# Patient Record
Sex: Male | Born: 2000
Health system: Southern US, Community
[De-identification: ages and names within clinical notes are randomized; demographics above are authoritative.]

## PROBLEM LIST (undated history)

## (undated) DIAGNOSIS — K4090 Unilateral inguinal hernia, without obstruction or gangrene, not specified as recurrent: Secondary | ICD-10-CM

## (undated) DIAGNOSIS — G43D0 Abdominal migraine, not intractable: Secondary | ICD-10-CM

## (undated) DIAGNOSIS — F909 Attention-deficit hyperactivity disorder, unspecified type: Secondary | ICD-10-CM

## (undated) DIAGNOSIS — J302 Other seasonal allergic rhinitis: Secondary | ICD-10-CM

## (undated) HISTORY — PX: CIRCUMCISION: SHX1350

---
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
## 2021-04-05 ENCOUNTER — Ambulatory Visit (INDEPENDENT_AMBULATORY_CARE_PROVIDER_SITE_OTHER): Payer: 59 | Admitting: Family Medicine

## 2021-04-05 ENCOUNTER — Encounter: Payer: Self-pay | Admitting: Family Medicine

## 2021-04-05 ENCOUNTER — Other Ambulatory Visit: Payer: Self-pay

## 2021-04-05 VITALS — BP 90/70 | HR 74 | Temp 97.6°F | Ht 67.0 in | Wt 165.5 lb

## 2021-04-05 DIAGNOSIS — Z0000 Encounter for general adult medical examination without abnormal findings: Secondary | ICD-10-CM

## 2021-04-05 MED ORDER — LISDEXAMFETAMINE DIMESYLATE 40 MG PO CAPS: 40.0000 mg | ORAL_CAPSULE | ORAL | 0 refills | Status: DC

## 2021-04-05 NOTE — Progress Notes (Signed)
William Diaz DOB: 08/25/2001 Encounter date: 04/05/2021  This is a 20 y.o. male who presents for complete physical   History of present illness/Additional concerns:  Things have been pretty good for him. Started college. 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 Still interested in cyber security.   Still exercising regularly. Still eating healthy. He is still vaping; daily multiple times.  Seasonal allergies: always act up in summer. Xyzal helps. Takes just in summer and then as needed.   ADD: didn't use medication last year. Hard to complete tasks, studying, work without medications. Grades were good first semester all A's, B's. A few C;s second semester.    Past Medical History:  Diagnosis Date   Abdominal migraine    ADHD (attention deficit hyperactivity disorder)    Inguinal hernia 01/2013   left   Seasonal allergies    Past Surgical History:  Procedure Laterality Date   CIRCUMCISION     INGUINAL HERNIA REPAIR Left 01/17/2013   Procedure: LEFT INGUINAL HERNIA REPAIR PEDIATRIC;  Surgeon: Judie Petit. Leonia Corona, MD;  Location: [facility];  Service: Pediatrics;  Laterality: Left;   Allergies  Allergen Reactions   Amoxicillin Rash   Current Meds  Medication Sig   levocetirizine (XYZAL) 5 MG tablet Take 5 mg by mouth every evening.   lisdexamfetamine (VYVANSE) 40 MG capsule    Social History   Tobacco Use   Smoking status: Never   Smokeless tobacco: Never   Tobacco comments:    father smokes outside  Substance Use Topics   Alcohol use: Yes    Alcohol/week: 5.0 standard drinks    Types: 5 Cans of beer per week   Family History  Problem Relation Age of Onset   Diabetes Maternal Grandfather    Heart disease Maternal Grandfather    Stroke Maternal Grandfather    Anesthesia problems Mother        post-op N/V   Migraines Mother        Occular Migraines   Miscarriages / [place] Mother    High blood pressure Father    High  Cholesterol Father    Healthy Sister    Colon cancer Maternal Grandmother    Heart attack Paternal Grandfather    Hypertension Maternal Uncle    Migraines Maternal Aunt        Occular Migraines   Migraines Cousin        Occular Migraines     Review of Systems  Constitutional:  Negative for activity change, appetite change, chills, fatigue, fever and unexpected weight change.  HENT:  Negative for congestion, ear pain, hearing loss, sinus pressure, sinus pain, sore throat and trouble swallowing.   Eyes:  Negative for pain and visual disturbance.  Respiratory:  Negative for cough, chest tightness, shortness of breath and wheezing.   Cardiovascular:  Negative for chest pain, palpitations and leg swelling.  Gastrointestinal:  Negative for abdominal distention, abdominal pain, blood in stool, constipation, diarrhea, nausea and vomiting.  Genitourinary:  Negative for decreased urine volume, difficulty urinating, dysuria, penile pain and testicular pain.  Musculoskeletal:  Negative for arthralgias, back pain and joint swelling.  Skin:  Negative for rash.  Neurological:  Negative for dizziness, weakness, numbness and headaches.  Hematological:  Negative for adenopathy. Does not bruise/bleed easily.  Psychiatric/Behavioral:  Negative for agitation, sleep disturbance and suicidal ideas. The patient is not nervous/anxious.    CBC:  Lab Results  Component Value Date   WBC 5.3 01/28/2015   HGB 14.3 01/28/2015  HCT 41.5 01/28/2015   MCH 28.5 01/28/2015   MCHC 34.5 01/28/2015   RDW 12.6 01/28/2015   PLT 169 01/28/2015   CMP: Lab Results  Component Value Date   NA 139 03/25/2020   K 4.3 03/25/2020   CL 102 03/25/2020   CO2 24 03/25/2020   ANIONGAP 8 01/28/2015   GLUCOSE 86 03/25/2020   BUN 11 03/25/2020   CREATININE 1.01 03/25/2020   GFRAA NOT CALCULATED 01/28/2015   CALCIUM 9.8 03/25/2020   PROT 7.2 08/24/2020   BILITOT 0.8 08/24/2020   ALKPHOS 287 01/28/2015   ALT 21 08/24/2020    AST 20 08/24/2020   LIPID: Lab Results  Component Value Date   CHOL 174 (H) 03/25/2020   TRIG 120 (H) 03/25/2020   HDL 48 03/25/2020   LDLCALC 104 03/25/2020    Objective:  BP 90/70 (BP Location: Left Arm, Patient Position: Sitting, Cuff Size: Large)   Pulse 74   Temp 97.6 F (36.4 C) (Oral)   Ht 5\' 7"  (1.702 m)   Wt 165 lb 8 oz (75.1 kg)   SpO2 98%   BMI 25.92 kg/m   Weight: 165 lb 8 oz (75.1 kg)   BP Readings from Last 3 Encounters:  04/05/21 90/70  03/25/20 102/74  10/26/17 (!) 97/59   Wt Readings from Last 3 Encounters:  04/05/21 165 lb 8 oz (75.1 kg) (69 %, Z= 0.48)*  03/25/20 167 lb 3.2 oz (75.8 kg) (76 %, Z= 0.69)*  10/26/17 119 lb 14.9 oz (54.4 kg) (30 %, Z= -0.51)*   * Growth percentiles are based on CDC (Boys, 2-20 Years) data.    Physical Exam Constitutional:      General: He is not in acute distress.    Appearance: He is well-developed.  HENT:     Head: Normocephalic and atraumatic.     Right Ear: External ear normal.     Left Ear: External ear normal.     Nose: Nose normal.     Mouth/Throat:     Pharynx: No oropharyngeal exudate.  Eyes:     Conjunctiva/sclera: Conjunctivae normal.     Pupils: Pupils are equal, round, and reactive to light.  Neck:     Thyroid: No thyromegaly.  Cardiovascular:     Rate and Rhythm: Normal rate and regular rhythm.     Heart sounds: Normal heart sounds. No murmur heard.   No friction rub. No gallop.  Pulmonary:     Effort: Pulmonary effort is normal. No respiratory distress.     Breath sounds: Normal breath sounds. No stridor. No wheezing or rales.  Abdominal:     General: Bowel sounds are normal.     Palpations: Abdomen is soft.  Genitourinary:    Penis: Normal.      Testes: Normal.  Musculoskeletal:        General: Normal range of motion.     Cervical back: Neck supple.  Skin:    General: Skin is warm and dry.  Neurological:     Mental Status: He is alert and oriented to person, place, and time.   Psychiatric:        Behavior: Behavior normal.        Thought Content: Thought content normal.        Judgment: Judgment normal.    Assessment/Plan: Health Maintenance Due  Topic Date Due   INFLUENZA VACCINE  04/05/2021   Health Maintenance reviewed.  1. Preventative health care Discussed working on quitting vaping. Discussed coming up with plan  to stay successful with quitting. Keep up with regular exercise and healthy eating. He did get third covid vaccine. Uncertain dates.    Return in about 1 year (around 04/05/2022) for physical exam.  Theodis Shove, MD

## 2021-04-15 ENCOUNTER — Telehealth: Payer: Self-pay | Admitting: *Deleted

## 2021-04-15 NOTE — Telephone Encounter (Signed)
Prior auth for Vyvanse 40mg  capsules sent to [email or web address] .

## 2021-04-19 ENCOUNTER — Telehealth: Payer: Self-pay

## 2021-04-19 NOTE — Telephone Encounter (Signed)
See prior phone note. 

## 2021-04-19 NOTE — Telephone Encounter (Signed)
See prior phone note from the patients mother which indicates insurance will not pay for the medication and options were given.

## 2021-04-19 NOTE — Telephone Encounter (Signed)
Per note in Covermymeds.com the Rx was covered as below: Approvedon August 11 Request Reference Number: [id number]. VYVANSE CAP 40MG  is approved through 04/15/2022. Your patient may now fill this prescription and it will be covered.  I informed 06/15/2022 at [facility] of this and left a detailed message at the patients home number with this information and to contact the pharmacy with further questions.

## 2021-04-19 NOTE — Telephone Encounter (Signed)
See other message. Did we get a denial on the vyvanse? Since he was on it previously and sx were controlled; there is chance they would cover. Please make sure on prior auth it is documented that he is not functioning well without medication and that this would be continuation of medication.

## 2021-04-19 NOTE — Telephone Encounter (Signed)
Let me know how he did with concerta/methylphenidate in the past? These both look like lower tiers when I pull up formulary online. Adderall would be option too. Let me know if any negative side effects with the concerta in the past. Once I know this I will send something in. We may have to have him follow up to dose adjust since we may be trying something new.

## 2021-04-20 ENCOUNTER — Telehealth: Payer: Self-pay

## 2021-04-20 NOTE — Telephone Encounter (Signed)
Patient's mother stated the lisdexamfetamine (VYVANSE) 40 MG capsule will cost her $300 and would like to know if there is a generic available.

## 2021-04-21 ENCOUNTER — Other Ambulatory Visit: Payer: Self-pay | Admitting: Family Medicine

## 2021-04-21 MED ORDER — AMPHETAMINE-DEXTROAMPHET ER 10 MG PO CP24: 10.0000 mg | ORAL_CAPSULE | Freq: Every day | ORAL | 0 refills | Status: DC

## 2021-04-21 NOTE — Telephone Encounter (Signed)
Ok. Let's try a lower end dose of adderall. We may have to work with insurance for coverage of whatever we choose, but this looked like it was better covered. I'm starting with low dose in hopes that we minimize side effects (typically with add meds these are loss of appetite and difficulty with sleep) - take med in morning to avoid sleeping issues at night. Update me in a couple of weeks with how he does with this.

## 2021-04-21 NOTE — Telephone Encounter (Signed)
Spoke with the patients mother and she stated the patient has only tried Concerta in the past and this worked for years.  She stated the patient discontinued this as it "made him feel like a zombie".  Message sent to PCP.

## 2021-04-21 NOTE — Telephone Encounter (Signed)
Please see other phone messages regarding medication. Need to know history of tolerance with other meds tried.

## 2021-04-21 NOTE — Telephone Encounter (Signed)
Spoke with the patients mother and informed her of the information below.

## 2022-02-28 NOTE — Progress Notes (Signed)
HPI: William Diaz is a 21 y.o. male, who is here today to establish care.  Former PCP: Dr. Hassan Diaz Last preventive routine visit: 04/05/21.  Chronic medical problems: Abdominal migraine,seasonal allergies,HLD,and ADHD among some. 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 Major in Sport and exercise psychologist.  Seasonal allergies: He is on Xyzal 5 mg daily. Post nasal drainage, cannot cough it up some times.It seems to be worse for the past 6 weeks. Xyzal does not help all the time. He has not identified exacerbating or alleviating factors.  Negative for fever,chills, cough,wheezing,stridor,or SOB. + Nasal congestion and mild rhinorrhea.  ADHD dx'ed in 3rd grade, 74-25 year old. He takes Adderall  XL 10 mg daily as needed. Medication helps, he takes it when he has to prepare final or has projects to complete. Denies anxiety or depression. Sleeps well, 7-8 hours.  Concerned about not being able to lose wt. He is counting calories and for the past few weeks try to stay between 1800-2000 cal/day and exercising regular ly. Lab Results  Component Value Date   CHOL 174 (H) 03/25/2020   HDL 48 03/25/2020   LDLCALC 104 03/25/2020   TRIG 120 (H) 03/25/2020   CHOLHDL 3.6 03/25/2020   Review of Systems  Constitutional:  Negative for activity change, appetite change and fever.  HENT:  Negative for mouth sores, nosebleeds and sore throat.   Eyes:  Negative for redness and visual disturbance.  Cardiovascular:  Negative for chest pain, palpitations and leg swelling.  Gastrointestinal:  Negative for abdominal pain, nausea and vomiting.  Endocrine: Negative for cold intolerance and heat intolerance.  Musculoskeletal:  Negative for gait problem and myalgias.  Allergic/Immunologic: Positive for environmental allergies.  Neurological:  Negative for syncope, weakness and headaches.  Rest see pertinent positives and negatives per HPI.  Current Outpatient Medications on File Prior to  Visit  Medication Sig Dispense Refill   levocetirizine (XYZAL) 5 MG tablet Take 5 mg by mouth every evening.     No current facility-administered medications on file prior to visit.   Past Medical History:  Diagnosis Date   Abdominal migraine    ADHD (attention deficit hyperactivity disorder)    Inguinal hernia 01/2013   left   Seasonal allergies    Allergies  Allergen Reactions   Amoxicillin Rash   Family History  Problem Relation Age of Onset   Diabetes Maternal Grandfather    Heart disease Maternal Grandfather    Stroke Maternal Grandfather    Anesthesia problems Mother        post-op N/V   Migraines Mother        Occular Migraines   Miscarriages / William Diaz Mother    High blood pressure Father    High Cholesterol Father    Healthy Sister    Colon cancer Maternal Grandmother    Heart attack Paternal Grandfather    Hypertension Maternal Uncle    Migraines Maternal Aunt        Occular Migraines   Migraines Cousin        Occular Migraines   Social History   Socioeconomic History   Marital status: Single    Spouse name: Not on file   Number of children: Not on file   Years of education: Not on file   Highest education level: Not on file  Occupational History   Not on file  Tobacco Use   Smoking status: Never   Smokeless tobacco: Never   Tobacco comments:    father smokes  outside  Vaping Use   Vaping Use: Every day  Substance and Sexual Activity   Alcohol use: Yes    Alcohol/week: 5.0 standard drinks of alcohol    Types: 5 Cans of beer per week   Drug use: Not Currently    Types: Marijuana   Sexual activity: Never  Other Topics Concern   Not on file  Social History Narrative   William Diaz attends 8 th grade at [employer]. He is doing well.   He enjoys playing soccer.   Lives with his parents and sibling.    Social Determinants of Health   Financial Resource Strain: Not on file  Food Insecurity: Not on file  Transportation Needs: Not on  file  Physical Activity: Not on file  Stress: Not on file  Social Connections: Not on file   Vitals:   03/01/22 1426  BP: 100/70  Pulse: 100  Resp: 12  SpO2: 99%   Wt Readings from Last 3 Encounters:  03/01/22 174 lb (78.9 kg) (74 %, Z= 0.66)*  04/05/21 165 lb 8 oz (75.1 kg) (69 %, Z= 0.48)*  03/25/20 167 lb 3.2 oz (75.8 kg) (76 %, Z= 0.69)*   * Growth percentiles are based on CDC (Boys, 2-20 Years) data.  Body mass index is 27.25 kg/m.  Physical Exam Nursing note reviewed.  Constitutional:      General: He is not in acute distress.    Appearance: He is well-developed.  HENT:     Head: Normocephalic and atraumatic.     Right Ear: Hearing, tympanic membrane, ear canal and external ear normal.     Left Ear: External ear normal.     Ears:     Comments: Left ear excess cerumen, cannot see TM.    Mouth/Throat:     Comments: Post nasal drainage. Eyes:     Conjunctiva/sclera: Conjunctivae normal.  Cardiovascular:     Rate and Rhythm: Normal rate and regular rhythm.     Heart sounds: No murmur heard. Pulmonary:     Effort: Pulmonary effort is normal. No respiratory distress.     Breath sounds: Normal breath sounds.  Abdominal:     Palpations: Abdomen is soft. There is no hepatomegaly or mass.     Tenderness: There is no abdominal tenderness.  Lymphadenopathy:     Cervical: No cervical adenopathy.  Skin:    General: Skin is warm.     Findings: No erythema or rash.  Neurological:     Mental Status: He is alert and oriented to person, place, and time.     Cranial Nerves: No cranial nerve deficit.     Gait: Gait normal.  Psychiatric:     Comments: Well groomed, good eye contact.   ASSESSMENT AND PLAN:  William Diaz was seen today for establish care.  Diagnoses and all orders for this visit:  Attention deficit hyperactivity disorder (ADHD), unspecified ADHD type Problem is adequately controlled. Continue Adderall XR 10 mg daily prn. We discussed some side  effects. Med contract signed today. PMP reviewed. F/U in 5-6 months, before if needed.  -     amphetamine-dextroamphetamine (ADDERALL XR) 10 MG 24 hr capsule; Take 1 capsule (10 mg total) by mouth daily.  Seasonal allergic rhinitis due to pollen May need to try a different antihistaminic, he has been on Xyzal for year. Nasal saline irrigations as needed. Flonase nasal spray daily as needed.  -     fluticasone (FLONASE) 50 MCG/ACT nasal spray; Place 2 sprays into both nostrils  daily as needed for allergies or rhinitis.  Overweight (BMI 25.0-29.9) He understands the benefits of wt loss as well as adverse effects of obesity. Recommend 1700-1800 Kcal/day. Consistency with healthy diet and physical activity encouraged.  Return in about 6 months (around 08/31/2022).  Aleane Wesenberg G. [place], MD  [facility] [place] office.

## 2022-03-01 ENCOUNTER — Encounter: Payer: Self-pay | Admitting: Family Medicine

## 2022-03-01 ENCOUNTER — Ambulatory Visit (INDEPENDENT_AMBULATORY_CARE_PROVIDER_SITE_OTHER): Payer: No Typology Code available for payment source | Admitting: Family Medicine

## 2022-03-01 ENCOUNTER — Ambulatory Visit: Payer: 59 | Admitting: Family Medicine

## 2022-03-01 VITALS — BP 100/70 | HR 100 | Resp 12 | Ht 67.0 in | Wt 174.0 lb

## 2022-03-01 DIAGNOSIS — J301 Allergic rhinitis due to pollen: Secondary | ICD-10-CM

## 2022-03-01 DIAGNOSIS — F909 Attention-deficit hyperactivity disorder, unspecified type: Secondary | ICD-10-CM | POA: Insufficient documentation

## 2022-03-01 DIAGNOSIS — E663 Overweight: Secondary | ICD-10-CM | POA: Diagnosis not present

## 2022-03-01 MED ORDER — AMPHETAMINE-DEXTROAMPHET ER 10 MG PO CP24: 10.0000 mg | ORAL_CAPSULE | Freq: Every day | ORAL | 0 refills | Status: DC

## 2022-03-01 MED ORDER — FLUTICASONE PROPIONATE 50 MCG/ACT NA SUSP: 2.0000 | Freq: Every day | NASAL | 3 refills | Status: AC | PRN

## 2022-04-06 ENCOUNTER — Encounter: Payer: 59 | Admitting: Family Medicine

## 2022-07-12 ENCOUNTER — Other Ambulatory Visit: Payer: Self-pay | Admitting: Family Medicine

## 2022-07-12 DIAGNOSIS — F909 Attention-deficit hyperactivity disorder, unspecified type: Secondary | ICD-10-CM

## 2022-07-12 MED ORDER — AMPHETAMINE-DEXTROAMPHET ER 10 MG PO CP24: 10.0000 mg | ORAL_CAPSULE | Freq: Every day | ORAL | 0 refills | Status: DC

## 2022-07-12 NOTE — Telephone Encounter (Signed)
Pt Mom would like a refill for Rx  amphetamine-dextroamphetamine (ADDERALL XR) 10 MG 24 hr capsule however would like a 3 mth supply sent to [facility] and to place it as the generic brand since the other brand is not covered by INS anymore.   Please call Lovey Newcomer once done #[phone]  Please advise.

## 2022-08-30 NOTE — Progress Notes (Unsigned)
William Diaz is a 21 y.o.male, who is here today to follow on ADHD. He was last seen on 03/01/22.  He reports no new problems since the last visit and states that the medication is helping significantly.  ADHD dx'ed in 3rd grade, 81-15 year old. Medication is still helping, he takes it when he has to prepare final or has projects he needs to complete. 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  Currently he is on Adderall XR 10 mg. He is tolerating medication well, no side effects reported. Negative for headaches,palpitations,tremor,or nerve tics.  He denies any illicit drug use and keeps medication in a safe place. Last refill 07/15/22.  He reports sleeping an average of 7-8 hours per night during the school year.  He denies experiencing depression or anxiety that interferes with his daily activities, although he admits to occasional mild anxiety related to regular life events.  Regarding his medication, he mentions that he has a sufficient supply and does not need a refill at this time.   Review of Systems  Constitutional:  Negative for activity change, appetite change, chills and fever.  Respiratory:  Negative for cough, shortness of breath and wheezing.   Cardiovascular:  Negative for chest pain.  Gastrointestinal:  Negative for abdominal pain, nausea and vomiting.  Neurological:  Negative for syncope, weakness and headaches.  See other pertinent positives and negatives in HPI.  Current Outpatient Medications on File Prior to Visit  Medication Sig Dispense Refill   amphetamine-dextroamphetamine (ADDERALL XR) 10 MG 24 hr capsule Take 1 capsule (10 mg total) by mouth daily. 30 capsule 0   fluticasone (FLONASE) 50 MCG/ACT nasal spray Place 2 sprays into both nostrils daily as needed for allergies or rhinitis. 16 g 3   levocetirizine (XYZAL) 5 MG tablet Take 5 mg by mouth every evening.     No current facility-administered medications  on file prior to visit.   Past Medical History:  Diagnosis Date   Abdominal migraine    ADHD (attention deficit hyperactivity disorder)    Inguinal hernia 01/2013   left   Seasonal allergies    Allergies  Allergen Reactions   Amoxicillin Rash   Social History   Socioeconomic History   Marital status: Single    Spouse name: Not on file   Number of children: Not on file   Years of education: Not on file   Highest education level: Not on file  Occupational History   Not on file  Tobacco Use   Smoking status: Never   Smokeless tobacco: Never   Tobacco comments:    father smokes outside  Vaping Use   Vaping Use: Every day  Substance and Sexual Activity   Alcohol use: Yes    Alcohol/week: 5.0 standard drinks of alcohol    Types: 5 Cans of beer per week   Drug use: Not Currently    Types: Marijuana   Sexual activity: Never  Other Topics Concern   Not on file  Social History Narrative   William Diaz attends 8 th grade at [employer]. He is doing well.   He enjoys playing soccer.   Lives with his parents and sibling.    Social Determinants of Health   Financial Resource Strain: Not on file  Food Insecurity: Not on file  Transportation Needs: Not on file  Physical Activity: Not on file  Stress: Not on file  Social Connections: Not on file   Vitals:   08/31/22 1101  BP: 120/70  Pulse: 88  Resp: 12  Temp: 97.6 F (36.4 C)  SpO2: 99%   Body mass index is 27.62 kg/m.  Physical Exam Vitals and nursing note reviewed.  Constitutional:      General: He is not in acute distress.    Appearance: He is well-developed.  HENT:     Head: Normocephalic and atraumatic.  Eyes:     Conjunctiva/sclera: Conjunctivae normal.  Cardiovascular:     Rate and Rhythm: Normal rate and regular rhythm.     Heart sounds: No murmur heard. Pulmonary:     Effort: Pulmonary effort is normal. No respiratory distress.     Breath sounds: Normal breath sounds.  Abdominal:      Palpations: Abdomen is soft. There is no mass.     Tenderness: There is no abdominal tenderness.  Skin:    General: Skin is warm.     Findings: No erythema.  Neurological:     Mental Status: He is alert and oriented to person, place, and time.  Psychiatric:        Mood and Affect: Mood and affect normal.   Assessment and plan Arius was seen today for follow-up.  Diagnoses and all orders for this visit:  Attention deficit hyperactivity disorder (ADHD), unspecified ADHD type Assessment & Plan: Symptoms well controlled, no changes in current management. Medication contract current, 02/2022. Side effects of medication reviewed. F/U in 5-6 months.   Need for influenza vaccination -     Flu Vaccine QUAD 44mo+IM (Fluarix, Fluzone & Alfiuria Quad PF)   Return in about 6 months (around 03/02/2023) for chronic problems.  Jeramine Delis G. [place], MD  [facility]. [place] office

## 2022-08-31 ENCOUNTER — Encounter: Payer: Self-pay | Admitting: Family Medicine

## 2022-08-31 ENCOUNTER — Ambulatory Visit (INDEPENDENT_AMBULATORY_CARE_PROVIDER_SITE_OTHER): Payer: No Typology Code available for payment source | Admitting: Family Medicine

## 2022-08-31 ENCOUNTER — Ambulatory Visit: Payer: No Typology Code available for payment source | Admitting: Family Medicine

## 2022-08-31 VITALS — BP 120/70 | HR 88 | Temp 97.6°F | Resp 12 | Ht 67.0 in | Wt 176.4 lb

## 2022-08-31 DIAGNOSIS — F909 Attention-deficit hyperactivity disorder, unspecified type: Secondary | ICD-10-CM

## 2022-08-31 DIAGNOSIS — Z23 Encounter for immunization: Secondary | ICD-10-CM

## 2022-08-31 NOTE — Patient Instructions (Addendum)
A few things to remember from today's visit:  Attention deficit hyperactivity disorder (ADHD), unspecified ADHD type  No changes today. Please call when you are getting ready for refills.  If you need refills for medications you take chronically, please call your pharmacy. Do not use My Chart to request refills or for acute issues that need immediate attention. If you send a my chart message, it may take a few days to be addressed, specially if I am not in the office.  Please be sure medication list is accurate. If a new problem present, please set up appointment sooner than planned today.

## 2022-08-31 NOTE — Assessment & Plan Note (Signed)
Symptoms well controlled, no changes in current management. Medication contract current, 02/2022. Side effects of medication reviewed. F/U in 5-6 months.

## 2023-03-06 ENCOUNTER — Telehealth: Payer: Self-pay

## 2023-03-06 NOTE — Telephone Encounter (Signed)
LVM for patient to call back [phone], or to call PCP office to schedule follow up apt. AS, CMA  

## 2023-09-05 ENCOUNTER — Other Ambulatory Visit: Payer: Self-pay | Admitting: Family Medicine

## 2023-09-05 DIAGNOSIS — F909 Attention-deficit hyperactivity disorder, unspecified type: Secondary | ICD-10-CM

## 2023-09-05 NOTE — Telephone Encounter (Signed)
 Copied from CRM [id number]. Topic: Clinical - Medication Refill >> Sep 05, 2023  3:16 PM Eleanor C wrote: Most Recent Primary Care Visit:  Provider: JORDAN, BETTY G  Department: [facility]  Visit Type: OFFICE VISIT  Date: 08/31/2022  Medication: amphetamine -dextroamphetamine (ADDERALL XR) 10 MG 24 hr capsule  Has the patient contacted their pharmacy? Yes (Agent: If no, request that the patient contact the pharmacy for the refill. If patient does not wish to contact the pharmacy document the reason why and proceed with request.) (Agent: If yes, when and what did the pharmacy advise?)  Is this the correct pharmacy for this prescription? Yes If no, delete pharmacy and type the correct one.  This is the patient's preferred pharmacy:  [facility] [address] [place], [place] [address] Phone: [phone]    Has the prescription been filled recently? No  Is the patient out of the medication? No  Has the patient been seen for an appointment in the last year OR does the patient have an upcoming appointment? Yes  Can we respond through MyChart? Yes  Agent: Please be advised that Rx refills may take up to 3 business days. We ask that you follow-up with your pharmacy.

## 2023-09-07 ENCOUNTER — Telehealth: Payer: Self-pay | Admitting: *Deleted

## 2023-09-07 ENCOUNTER — Telehealth: Payer: Self-pay

## 2023-09-07 NOTE — Telephone Encounter (Signed)
 Copied from CRM [id number]. Topic: Clinical - Medication Refill >> Sep 05, 2023  3:16 PM Eleanor C wrote: Most Recent Primary Care Visit:  Provider: JORDAN, BETTY G  Department: [facility]  Visit Type: OFFICE VISIT  Date: 08/31/2022  Medication: amphetamine -dextroamphetamine (ADDERALL XR) 10 MG 24 hr capsule  Has the patient contacted their pharmacy? Yes (Agent: If no, request that the patient contact the pharmacy for the refill. If patient does not wish to contact the pharmacy document the reason why and proceed with request.) (Agent: If yes, when and what did the pharmacy advise?)  Is this the correct pharmacy for this prescription? Yes If no, delete pharmacy and type the correct one.  This is the patient's preferred pharmacy:  [facility] [address] [place], [place] [address] Phone: [phone]    Has the prescription been filled recently? No  Is the patient out of the medication? No  Has the patient been seen for an appointment in the last year OR does the patient have an upcoming appointment? Yes  Can we respond through MyChart? Yes  Agent: Please be advised that Rx refills may take up to 3 business days. We ask that you follow-up with your pharmacy.

## 2023-09-07 NOTE — Telephone Encounter (Signed)
 Copied from CRM [id number]. Topic: Clinical - Medication Refill >> Sep 04, 2023  2:43 PM Graeme ORN wrote: Most Recent Primary Care Visit:  Provider: JORDAN, BETTY G  Department: [facility]  Visit Type: OFFICE VISIT  Date: 08/31/2022  Medication: amphetamine -dextroamphetamine (ADDERALL XR) 10 MG 24 hr capsule  Has the patient contacted their pharmacy? No (Agent: If no, request that the patient contact the pharmacy for the refill. If patient does not wish to contact the pharmacy document the reason why and proceed with request.) (Agent: If yes, when and what did the pharmacy advise?)  Is this the correct pharmacy for this prescription? No If no, delete pharmacy and type the correct one.  This is the patient's preferred pharmacy:  [facility] Address: [address] [place], [place] [address] [phone]   Has the prescription been filled recently? No  Is the patient out of the medication? No  Has the patient been seen for an appointment in the last year OR does the patient have an upcoming appointment? Yes  Can we respond through MyChart? No  Agent: Please be advised that Rx refills may take up to 3 business days. We ask that you follow-up with your pharmacy.

## 2023-09-08 ENCOUNTER — Telehealth: Payer: Self-pay | Admitting: *Deleted

## 2023-09-08 NOTE — Telephone Encounter (Signed)
 Copied from CRM [id number]. Topic: Clinical - Prescription Issue >> Sep 07, 2023  4:44 PM Evie B wrote: Reason for CRM: pt called to follow up on medication refill pt states he has been requesting a medication refill since 09/01/23 and the pharmacy still does not have the orders. Pt is out of medication, requesting a call back [phone]

## 2023-09-11 NOTE — Telephone Encounter (Signed)
 I called and spoke with patient. I advised him he hadn't been seen since 2023, so we would need to schedule an appointment. Appointment made for Friday at 3pm.

## 2023-09-11 NOTE — Telephone Encounter (Signed)
Has appt 1/10

## 2023-09-15 ENCOUNTER — Ambulatory Visit: Payer: No Typology Code available for payment source | Admitting: Family Medicine

## 2023-09-29 ENCOUNTER — Ambulatory Visit: Payer: No Typology Code available for payment source | Admitting: Family Medicine

## 2023-09-29 ENCOUNTER — Encounter: Payer: Self-pay | Admitting: Family Medicine

## 2023-10-06 ENCOUNTER — Ambulatory Visit: Payer: No Typology Code available for payment source | Admitting: Family Medicine

## 2024-07-01 ENCOUNTER — Ambulatory Visit (INDEPENDENT_AMBULATORY_CARE_PROVIDER_SITE_OTHER): Admitting: Family Medicine

## 2024-07-01 ENCOUNTER — Encounter: Payer: Self-pay | Admitting: Family Medicine

## 2024-07-01 ENCOUNTER — Ambulatory Visit: Payer: Self-pay | Admitting: Family Medicine

## 2024-07-01 VITALS — BP 96/70 | HR 77 | Temp 98.4°F | Resp 16 | Ht 67.28 in | Wt 174.8 lb

## 2024-07-01 DIAGNOSIS — Z23 Encounter for immunization: Secondary | ICD-10-CM

## 2024-07-01 DIAGNOSIS — F909 Attention-deficit hyperactivity disorder, unspecified type: Secondary | ICD-10-CM

## 2024-07-01 DIAGNOSIS — Z0000 Encounter for general adult medical examination without abnormal findings: Secondary | ICD-10-CM | POA: Insufficient documentation

## 2024-07-01 DIAGNOSIS — E785 Hyperlipidemia, unspecified: Secondary | ICD-10-CM | POA: Insufficient documentation

## 2024-07-01 LAB — COMPREHENSIVE METABOLIC PANEL WITH GFR
ALT: 21 U/L (ref 0–53)
AST: 20 U/L (ref 0–37)
Albumin: 4.9 g/dL (ref 3.5–5.2)
Alkaline Phosphatase: 61 U/L (ref 39–117)
BUN: 10 mg/dL (ref 6–23)
CO2: 30 meq/L (ref 19–32)
Calcium: 9.9 mg/dL (ref 8.4–10.5)
Chloride: 100 meq/L (ref 96–112)
Creatinine, Ser: 1.04 mg/dL (ref 0.40–1.50)
GFR: 102.06 mL/min (ref >60.00)
Glucose, Bld: 95 mg/dL (ref 70–99)
Potassium: 4.5 meq/L (ref 3.5–5.1)
Sodium: 137 meq/L (ref 135–145)
Total Bilirubin: 1 mg/dL (ref 0.2–1.2)
Total Protein: 7.7 g/dL (ref 6.0–8.3)

## 2024-07-01 LAB — LIPID PANEL
Cholesterol: 184 mg/dL (ref 0–200)
HDL: 44.9 mg/dL (ref >39.00)
LDL Cholesterol: 112 mg/dL — ABNORMAL HIGH (ref 0–99)
NonHDL: 138.87
Total CHOL/HDL Ratio: 4
Triglycerides: 133 mg/dL (ref 0.0–149.0)
VLDL: 26.6 mg/dL (ref 0.0–40.0)

## 2024-07-01 MED ORDER — AMPHETAMINE-DEXTROAMPHET ER 10 MG PO CP24: 10.0000 mg | ORAL_CAPSULE | Freq: Every day | ORAL | 0 refills | Status: DC

## 2024-07-01 NOTE — Patient Instructions (Addendum)
 A few things to remember from today's visit:  Routine general medical examination at a health care facility  Influenza vaccine needed - Plan: Flu vaccine trivalent PF, 6mos and older(Flulaval,Afluria,Fluarix,Fluzone)  Hyperlipidemia, unspecified hyperlipidemia type - Plan: Lipid panel, Comprehensive metabolic panel with GFR  Attention deficit hyperactivity disorder (ADHD), unspecified ADHD type - Plan: amphetamine -dextroamphetamine (ADDERALL XR) 10 MG 24 hr capsule  Resume Adderall XL 10 mg daily. I see you back in 6-8 weeks.  If you need refills for medications you take chronically, please call your pharmacy. Do not use My Chart to request refills or for acute issues that need immediate attention. If you send a my chart message, it may take a few days to be addressed, specially if I am not in the office.  Please be sure medication list is accurate. If a new problem present, please set up appointment sooner than planned today.  Health Maintenance, Male Adopting a healthy lifestyle and getting preventive care are important in promoting health and wellness. Ask your health care provider about: The right schedule for you to have regular tests and exams. Things you can do on your own to prevent diseases and keep yourself healthy. What should I know about diet, weight, and exercise? Eat a healthy diet  Eat a diet that includes plenty of vegetables, fruits, low-fat dairy products, and lean protein. Do not eat a lot of foods that are high in solid fats, added sugars, or sodium. Maintain a healthy weight Body mass index (BMI) is a measurement that can be used to identify possible weight problems. It estimates body fat based on height and weight. Your health care provider can help determine your BMI and help you achieve or maintain a healthy weight. Get regular exercise Get regular exercise. This is one of the most important things you can do for your health. Most adults should: Exercise for  at least 150 minutes each week. The exercise should increase your heart rate and make you sweat (moderate-intensity exercise). Do strengthening exercises at least twice a week. This is in addition to the moderate-intensity exercise. Spend less time sitting. Even light physical activity can be beneficial. Watch cholesterol and blood lipids Have your blood tested for lipids and cholesterol at 22 years of age, then have this test every 5 years. You may need to have your cholesterol levels checked more often if: Your lipid or cholesterol levels are high. You are older than 22 years of age. You are at high risk for heart disease. What should I know about cancer screening? Many types of cancers can be detected early and may often be prevented. Depending on your health history and family history, you may need to have cancer screening at various ages. This may include screening for: Colorectal cancer. Prostate cancer. Skin cancer. Lung cancer. What should I know about heart disease, diabetes, and high blood pressure? Blood pressure and heart disease High blood pressure causes heart disease and increases the risk of stroke. This is more likely to develop in people who have high blood pressure readings or are overweight. Talk with your health care provider about your target blood pressure readings. Have your blood pressure checked: Every 3-5 years if you are 69-58 years of age. Every year if you are 30 years old or older. If you are between the ages of 2 and 46 and are a current or former smoker, ask your health care provider if you should have a one-time screening for abdominal aortic aneurysm (AAA). Diabetes Have regular diabetes  screenings. This checks your fasting blood sugar level. Have the screening done: Once every three years after age 39 if you are at a normal weight and have a low risk for diabetes. More often and at a younger age if you are overweight or have a high risk for  diabetes. What should I know about preventing infection? Hepatitis B If you have a higher risk for hepatitis B, you should be screened for this virus. Talk with your health care provider to find out if you are at risk for hepatitis B infection. Hepatitis C Blood testing is recommended for: Everyone born from [birth year] through 1964. Anyone with known risk factors for hepatitis C. Sexually transmitted infections (STIs) You should be screened each year for STIs, including gonorrhea and chlamydia, if: You are sexually active and are younger than 22 years of age. You are older than 22 years of age and your health care provider tells you that you are at risk for this type of infection. Your sexual activity has changed since you were last screened, and you are at increased risk for chlamydia or gonorrhea. Ask your health care provider if you are at risk. Ask your health care provider about whether you are at high risk for HIV. Your health care provider may recommend a prescription medicine to help prevent HIV infection. If you choose to take medicine to prevent HIV, you should first get tested for HIV. You should then be tested every 3 months for as long as you are taking the medicine. Follow these instructions at home: Alcohol use Do not drink alcohol if your health care provider tells you not to drink. If you drink alcohol: Limit how much you have to 0-2 drinks a day. Know how much alcohol is in your drink. In the [place] one drink equals one 12 oz bottle of beer (355 mL), one 5 oz glass of wine (148 mL), or one 1 oz glass of hard liquor (44 mL). Lifestyle Do not use any products that contain nicotine or tobacco. These products include cigarettes, chewing tobacco, and vaping devices, such as e-cigarettes. If you need help quitting, ask your health care provider. Do not use street drugs. Do not share needles. Ask your health care provider for help if you need support or information about quitting  drugs. General instructions Schedule regular health, dental, and eye exams. Stay current with your vaccines. Tell your health care provider if: You often feel depressed. You have ever been abused or do not feel safe at home. Summary Adopting a healthy lifestyle and getting preventive care are important in promoting health and wellness. Follow your health care provider's instructions about healthy diet, exercising, and getting tested or screened for diseases. Follow your health care provider's instructions on monitoring your cholesterol and blood pressure. This information is not intended to replace advice given to you by your health care provider. Make sure you discuss any questions you have with your health care provider. Document Revised: 01/11/2021 Document Reviewed: 01/11/2021 Elsevier Patient Education  2024 [employer].

## 2024-07-01 NOTE — Assessment & Plan Note (Signed)
 He just started a new job and would like to resume Adderall XR 10 mg, which he was taking daily as needed. We discussed the importance of following as recommended. Prescription sent to his pharmacy. PDMP reviewed. Follow-up in 6 to 8 weeks.

## 2024-07-01 NOTE — Progress Notes (Signed)
 Chief Complaint  Patient presents with   Annual Exam    Pt would like to go back on Adderall.    Discussed the use of AI scribe software for clinical note transcription with the patient, who gave verbal consent to proceed. History of Present Illness William Diaz is a 23 year old male here today for his routine CPE and follow up. He was last seen on 08/31/22. Last CPE over a year ago. No new problems since his last visit.  He engages in regular physical activity, having recently resumed daily walks of at least a mile for less than a month, and plans to return to the gym.  His nutrition includes daily vegetable intake and fiber supplementation.  He sleeps between six to eight hours per night.  Socially, he drinks alcohol on weekends, consuming three to five beers, and vapes but is trying to quit.  He has been with the same partner for several years and does not feel the need for STD screening.  He does not use any illicit drugs.  He has not had an eye exam in over a year and wears glasses infrequently.  He has a education officer, community he visits regularly.   Immunization History  Administered Date(s) Administered   DTaP 05/14/2002, 07/09/2002, 06/10/2003, 05/18/2006   Dtap, Unspecified 09/16/2002   HIB, Unspecified 05/14/2002, 07/09/2002, 09/16/2002, 06/10/2003   HPV 9-valent 04/02/2014, 06/06/2014, 10/08/2014   HPV Quadrivalent 04/02/2014, 06/06/2014, 10/08/2014   Hep B, Unspecified [birth year] 2001, 04/11/2002, 12/10/2002   Hepatitis A 05/18/2006, 04/19/2007   Hepatitis A, Ped/Adol-2 Dose 05/18/2006, 04/19/2007   IPV 05/14/2002, 07/09/2002, 05/18/2006   Influenza Nasal 08/26/2009, 05/18/2011   Influenza, Seasonal, Injecte, Preservative Fre 07/12/2008   Influenza,Quad,Nasal, Live 07/26/2012, 07/31/2013, 06/06/2014   Influenza,inj,Quad PF,6+ Mos 08/27/2015, 08/22/2016, 07/07/2017, 06/14/2018, 08/31/2022   Influenza-Unspecified 06/10/2003, 06/20/2003, 07/23/2003, 08/16/2005   MMR  03/12/2003, 05/18/2006   MenQuadfi_Meningococcal Groups ACYW Conjugate 03/15/2013, 06/14/2018   PFIZER(Purple Top)SARS-COV-2 Vaccination 11/14/2019, 12/06/2019, 01/08/2020, 02/02/2020, 07/04/2020   Pneumococcal Conjugate PCV 7 05/14/2002, 07/09/2002, 09/16/2002, 06/10/2003   Tdap 04/13/2012   Varicella 06/10/2003, 05/18/2006    Health Maintenance  Topic Date Due   Meningococcal B Vaccine (1 of 2 - Standard) Never done   Hepatitis C Screening  Never done   DTaP/Tdap/Td (7 - Td or Tdap) 04/13/2022   Influenza Vaccine  04/05/2024   COVID-19 Vaccine (6 - 2025-26 season) 07/17/2024 (Originally 05/06/2024)   HPV VACCINES  Completed   HIV Screening  Completed   Pneumococcal Vaccine  Aged Out   He was diagnosed with ADHD in third grade and was on Adderall  XR 10 mg daily since then, although he stopped using it for a period when he felt he was managing well without it. Recently, he took a leftover dose from 2023 and found it helped with productivity, but he disposed of the remaining medication after realizing it was expired.  HLD: He is not on pharmacologic treatment.  Lab Results  Component Value Date   CHOL 174 (H) 03/25/2020   HDL 48 03/25/2020   LDLCALC 104 03/25/2020   TRIG 120 (H) 03/25/2020   CHOLHDL 3.6 03/25/2020   Review of Systems  Constitutional:  Negative for activity change, appetite change and fever.  HENT:  Negative for sore throat and trouble swallowing.   Eyes:  Negative for redness and visual disturbance.  Respiratory:  Negative for cough, shortness of breath and wheezing.   Cardiovascular:  Negative for chest pain, palpitations and leg swelling.  Gastrointestinal:  Negative  for abdominal pain, blood in stool, nausea and vomiting.  Endocrine: Negative for cold intolerance, heat intolerance, polydipsia, polyphagia and polyuria.  Genitourinary:  Negative for decreased urine volume, dysuria, genital sores, hematuria and testicular pain.  Musculoskeletal:  Negative for  gait problem and myalgias.  Skin:  Negative for color change and rash.  Allergic/Immunologic: Positive for environmental allergies.  Neurological:  Negative for syncope, weakness and headaches.  Hematological:  Negative for adenopathy. Does not bruise/bleed easily.  Psychiatric/Behavioral:  Negative for confusion, hallucinations and sleep disturbance.    Current Outpatient Medications on File Prior to Visit  Medication Sig Dispense Refill   levocetirizine (XYZAL) 5 MG tablet Take 5 mg by mouth every evening.     fluticasone  (FLONASE ) 50 MCG/ACT nasal spray Place 2 sprays into both nostrils daily as needed for allergies or rhinitis. (Patient not taking: Reported on 07/01/2024) 16 g 3   No current facility-administered medications on file prior to visit.   Past Medical History:  Diagnosis Date   Abdominal migraine    ADHD (attention deficit hyperactivity disorder)    Inguinal hernia 01/2013   left   Seasonal allergies    Past Surgical History:  Procedure Laterality Date   CIRCUMCISION     INGUINAL HERNIA REPAIR Left 01/17/2013   Procedure: LEFT INGUINAL HERNIA REPAIR PEDIATRIC;  Surgeon: CHRISTELLA. Julietta Millman, MD;  Location: [facility];  Service: Pediatrics;  Laterality: Left;   Allergies  Allergen Reactions   Amoxicillin Rash    Family History  Problem Relation Age of Onset   Diabetes Maternal Grandfather    Heart disease Maternal Grandfather    Stroke Maternal Grandfather    Anesthesia problems Mother        post-op N/V   Migraines Mother        Occular Migraines   Miscarriages / Stillbirths Mother    High blood pressure Father    High Cholesterol Father    Healthy Sister    Colon cancer Maternal Grandmother    Heart attack Paternal Grandfather    Hypertension Maternal Uncle    Migraines Maternal Aunt        Occular Migraines   Migraines Cousin        Occular Migraines    Social History   Socioeconomic History   Marital status: Single    Spouse  name: Not on file   Number of children: Not on file   Years of education: Not on file   Highest education level: Not on file  Occupational History   Not on file  Tobacco Use   Smoking status: Never   Smokeless tobacco: Never   Tobacco comments:    father smokes outside  Vaping Use   Vaping status: Every Day  Substance and Sexual Activity   Alcohol use: Yes    Alcohol/week: 5.0 standard drinks of alcohol    Types: 5 Cans of beer per week   Drug use: Not Currently    Types: Marijuana   Sexual activity: Never  Other Topics Concern   Not on file  Social History Narrative   [place] attends 8 th grade at [employer]. He is doing well.   He enjoys playing soccer.   Lives with his parents and sibling.    Social Drivers of Corporate Investment Banker Strain: Not on file  Food Insecurity: Not on file  Transportation Needs: Not on file  Physical Activity: Not on file  Stress: Not on file  Social Connections: Not on  file   Today's Vitals   07/01/24 1338  BP: 96/70  Pulse: 77  Resp: 16  Temp: 98.4 F (36.9 C)  TempSrc: Oral  SpO2: 97%  Weight: 174 lb 12.8 oz (79.3 kg)  Height: 5' 7.28 (1.709 m)   Body mass index is 27.15 kg/m.  Wt Readings from Last 3 Encounters:  07/01/24 174 lb 12.8 oz (79.3 kg)  08/31/22 176 lb 6 oz (80 kg)  03/01/22 174 lb (78.9 kg) (74%, Z= 0.66)*   * Growth percentiles are based on CDC (Boys, 2-20 Years) data.   Physical Exam Vitals and nursing note reviewed.  Constitutional:      General: He is not in acute distress.    Appearance: He is well-developed.  HENT:     Head: Normocephalic and atraumatic.     Right Ear: Tympanic membrane, ear canal and external ear normal.     Left Ear: External ear normal.     Ears:     Comments: Left ear canal excess cerumen, could not see TM    Mouth/Throat:     Mouth: Mucous membranes are moist.     Pharynx: Oropharynx is clear.  Eyes:     Extraocular Movements: Extraocular movements  intact.     Conjunctiva/sclera: Conjunctivae normal.     Pupils: Pupils are equal, round, and reactive to light.  Neck:     Thyroid: No thyroid mass or thyromegaly.  Cardiovascular:     Rate and Rhythm: Normal rate and regular rhythm.     Pulses:          Dorsalis pedis pulses are 2+ on the right side and 2+ on the left side.     Heart sounds: No murmur heard. Pulmonary:     Effort: Pulmonary effort is normal. No respiratory distress.     Breath sounds: Normal breath sounds.  Abdominal:     Palpations: Abdomen is soft. There is no hepatomegaly or mass.     Tenderness: There is no abdominal tenderness.  Genitourinary:    Comments: No concerns. Musculoskeletal:        General: No tenderness.     Cervical back: Normal range of motion.     Comments: No major deformities appreciated and no signs of synovitis.  Lymphadenopathy:     Cervical: No cervical adenopathy.     Upper Body:     Right upper body: No supraclavicular adenopathy.     Left upper body: No supraclavicular adenopathy.  Skin:    General: Skin is warm.     Findings: No erythema.  Neurological:     General: No focal deficit present.     Mental Status: He is alert and oriented to person, place, and time.     Cranial Nerves: No cranial nerve deficit.     Sensory: No sensory deficit.     Gait: Gait normal.     Deep Tendon Reflexes:     Reflex Scores:      Bicep reflexes are 2+ on the right side and 2+ on the left side.      Patellar reflexes are 2+ on the right side and 2+ on the left side. Psychiatric:        Mood and Affect: Mood and affect normal.   ASSESSMENT AND PLAN:  Conway Fedora was seen today for annual exam.  Diagnoses and all orders for this visit: Orders Placed This Encounter  Procedures   Flu vaccine trivalent PF, 6mos and older(Flulaval,Afluria,Fluarix,Fluzone)   Lipid panel  Comprehensive metabolic panel with GFR   Lab Results  Component Value Date   CHOL 184 07/01/2024   HDL 44.90  07/01/2024   LDLCALC 112 (H) 07/01/2024   TRIG 133.0 07/01/2024   CHOLHDL 4 07/01/2024   Lab Results  Component Value Date   ALT 21 07/01/2024   AST 20 07/01/2024   ALKPHOS 61 07/01/2024   BILITOT 1.0 07/01/2024   Lab Results  Component Value Date   NA 137 07/01/2024   CL 100 07/01/2024   K 4.5 07/01/2024   CO2 30 07/01/2024   BUN 10 07/01/2024   CREATININE 1.04 07/01/2024   GFR 102.06 07/01/2024   CALCIUM 9.9 07/01/2024   ALBUMIN 4.9 07/01/2024   GLUCOSE 95 07/01/2024   Routine general medical examination at a health care facility Assessment & Plan: We discussed the importance of regular physical activity and healthy diet for prevention of chronic illness and/or complications. Preventive guidelines reviewed. Vaccination updated today, influenza and Tdap. Next CPE in a year.   Influenza vaccine needed -     Flu vaccine trivalent PF, 6mos and older(Flulaval,Afluria,Fluarix,Fluzone)  Hyperlipidemia, unspecified hyperlipidemia type Assessment & Plan: Mild. Continue nonpharmacologic treatment. Further recommendation will be given according to lab results.  Orders: -     Lipid panel; Future -     Comprehensive metabolic panel with GFR; Future  Attention deficit hyperactivity disorder (ADHD), unspecified ADHD type Assessment & Plan: He just started a new job and would like to resume Adderall XR 10 mg, which he was taking daily as needed. We discussed the importance of following as recommended. Prescription sent to his pharmacy. PDMP reviewed. Follow-up in 6 to 8 weeks.  Orders: -     Amphetamine -Dextroamphet ER; Take 1 capsule (10 mg total) by mouth daily.  Dispense: 30 capsule; Refill: 0  Return in about 6 weeks (around 08/12/2024) for chronic problems.  Jadien Lehigh G. Arleigh Dicola, MD  [facility]. [place] office.

## 2024-07-01 NOTE — Assessment & Plan Note (Signed)
 We discussed the importance of regular physical activity and healthy diet for prevention of chronic illness and/or complications. Preventive guidelines reviewed. Vaccination updated today, influenza and Tdap. Next CPE in a year.

## 2024-07-01 NOTE — Assessment & Plan Note (Signed)
 Mild. Continue nonpharmacologic treatment. Further recommendation will be given according to lab results.

## 2024-07-11 ENCOUNTER — Emergency Department (HOSPITAL_BASED_OUTPATIENT_CLINIC_OR_DEPARTMENT_OTHER)
Admission: EM | Admit: 2024-07-11 | Discharge: 2024-07-11 | Disposition: A | Discharge status: Home / Self Care | Attending: Emergency Medicine | Admitting: Emergency Medicine

## 2024-07-11 ENCOUNTER — Encounter (HOSPITAL_BASED_OUTPATIENT_CLINIC_OR_DEPARTMENT_OTHER): Payer: Self-pay | Admitting: Emergency Medicine

## 2024-07-11 ENCOUNTER — Other Ambulatory Visit (HOSPITAL_BASED_OUTPATIENT_CLINIC_OR_DEPARTMENT_OTHER): Payer: Self-pay

## 2024-07-11 ENCOUNTER — Other Ambulatory Visit: Payer: Self-pay

## 2024-07-11 DIAGNOSIS — T783XXA Angioneurotic edema, initial encounter: Secondary | ICD-10-CM | POA: Insufficient documentation

## 2024-07-11 DIAGNOSIS — T7840XA Allergy, unspecified, initial encounter: Secondary | ICD-10-CM | POA: Diagnosis present

## 2024-07-11 MED ORDER — EPINEPHRINE 0.3 MG/0.3ML IJ SOAJ
INTRAMUSCULAR | Status: AC
  Filled 2024-07-11: qty 0.3

## 2024-07-11 MED ORDER — EPINEPHRINE 0.3 MG/0.3ML IJ SOAJ
0.3000 mg | Freq: Once | INTRAMUSCULAR | Status: AC
  Administered 2024-07-11: 0.3 mg via INTRAMUSCULAR

## 2024-07-11 MED ORDER — PREDNISONE 10 MG (21) PO TBPK: ORAL_TABLET | ORAL | 0 refills | Status: DC

## 2024-07-11 MED ORDER — METHYLPREDNISOLONE SODIUM SUCC 125 MG IJ SOLR
125.0000 mg | Freq: Once | INTRAMUSCULAR | Status: AC
  Administered 2024-07-11: 125 mg via INTRAVENOUS
  Filled 2024-07-11: qty 2

## 2024-07-11 MED ORDER — EPINEPHRINE 0.3 MG/0.3ML IJ SOAJ: 0.3000 mg | INTRAMUSCULAR | 0 refills | Status: AC | PRN

## 2024-07-11 MED ORDER — EPINEPHRINE 0.3 MG/0.3ML IJ SOAJ: 0.3000 mg | INTRAMUSCULAR | 0 refills | Status: DC | PRN

## 2024-07-11 NOTE — ED Triage Notes (Signed)
 Pt arrives with mother, reports he ate one everything bagel cashew around 10pm, instantly began to feel throat itching, developed generalized hives and then took 2 benadryl at 2230, Xyzal at 2300 - followed by multiple episodes of emesis. Pt arrives with continued need to clear throat, light hives to torso. Possible hx of unconfirmed tree nut allergy

## 2024-07-11 NOTE — ED Provider Notes (Signed)
 [place] EMERGENCY DEPARTMENT AT [facility]  Provider Note  CSN: [id number] Arrival date & time: 07/11/24 0023  History Chief Complaint  Patient presents with   Allergic Reaction    William Diaz is a 23 y.o. male with no significant PMH here for allergic reaction, onset after eating a cashew with 'everything bagel seasoning' around 2200hrs. Began having itchy, scratchy throat, then vomited and developed hives. Mother gave him 50mg  benadryl which he immediately vomited, so she gave him additional 50mg  and then a dose of zyzal with minimal improvement. He reports hives and nausea have since improved but feels like his uvula is swollen. No tongue or lip swelling, no difficulty breathing.    Home Medications Prior to Admission medications   Medication Sig Start Date End Date Taking? Authorizing Provider  amphetamine -dextroamphetamine (ADDERALL XR) 10 MG 24 hr capsule Take 1 capsule (10 mg total) by mouth daily. 07/01/24   Jordan, Betty G, MD  EPINEPHrine  0.3 mg/0.3 mL IJ SOAJ injection Inject 0.3 mg into the muscle as needed for anaphylaxis. 07/11/24   Roselyn Carlin NOVAK, MD  fluticasone  (FLONASE ) 50 MCG/ACT nasal spray Place 2 sprays into both nostrils daily as needed for allergies or rhinitis. Patient not taking: Reported on 07/01/2024 03/01/22   Jordan, Betty G, MD  levocetirizine (XYZAL) 5 MG tablet Take 5 mg by mouth every evening.    [provider]  predniSONE (STERAPRED UNI-PAK 21 TAB) 10 MG (21) TBPK tablet 10mg  Tabs, 6 day taper. Use as directed 07/11/24   Roselyn Carlin NOVAK, MD     Allergies    Amoxicillin   Review of Systems   Review of Systems Please see HPI for pertinent positives and negatives  Physical Exam BP 101/65   Pulse 98   Resp 16   Wt 79.3 kg   SpO2 97%   BMI 27.15 kg/m   Physical Exam Vitals and nursing note reviewed.  Constitutional:      Appearance: Normal appearance.  HENT:     Head: Normocephalic and atraumatic.      Nose: Nose normal.     Mouth/Throat:     Mouth: Mucous membranes are moist.     Comments: Moderate uvular angioedema, tongue is normal Eyes:     Extraocular Movements: Extraocular movements intact.     Conjunctiva/sclera: Conjunctivae normal.  Cardiovascular:     Rate and Rhythm: Tachycardia present.  Pulmonary:     Effort: Pulmonary effort is normal.     Breath sounds: Normal breath sounds.  Abdominal:     General: Abdomen is flat.     Palpations: Abdomen is soft.     Tenderness: There is no abdominal tenderness.  Musculoskeletal:        General: No swelling. Normal range of motion.     Cervical back: Neck supple.  Skin:    General: Skin is warm and dry.     Findings: No rash.  Neurological:     General: No focal deficit present.     Mental Status: He is alert.  Psychiatric:        Mood and Affect: Mood normal.     ED Results / Procedures / Treatments   EKG EKG Interpretation Date/Time:  Thursday July 11 2024 00:33:05 EST Ventricular Rate:  129 PR Interval:  136 QRS Duration:  88 QT Interval:  302 QTC Calculation: 443 R Axis:   85  Text Interpretation: Sinus tachycardia Since last tracing Rate faster Confirmed by Roselyn Carlin [phone]) on 07/11/2024 12:45:58  AM  Procedures .Critical Care  Performed by: Roselyn Carlin NOVAK, MD Authorized by: Roselyn Carlin NOVAK, MD   Critical care provider statement:    Critical care time (minutes):  50   Critical care time was exclusive of:  Separately billable procedures and treating other patients   Critical care was necessary to treat or prevent imminent or life-threatening deterioration of the following conditions: anaphylaxis.   Critical care was time spent personally by me on the following activities:  Development of treatment plan with patient or surrogate, evaluation of patient's response to treatment, examination of patient, ordering and performing treatments and interventions, pulse oximetry and re-evaluation of  patient's condition   Medications Ordered in the ED Medications  EPINEPHrine  (EPI-PEN) 0.3 mg/0.3 mL injection (has no administration in time range)  EPINEPHrine  (EPI-PEN) injection 0.3 mg (0.3 mg Intramuscular Given 07/11/24 0045)  methylPREDNISolone sodium succinate (SOLU-MEDROL) 125 mg/2 mL injection 125 mg (125 mg Intravenous Given 07/11/24 0302)    Initial Impression and Plan  Patient here with allergic reaction after eating a cashew with everything bagel seasoning. Likely from the cashew but could also be something in the seasoning. He has angioedema of uvula, but otherwise airway is intact. Will give Epipen  and monitor in the ED. He is already tachycardic in triagebut  improved some with resting in bed.   ED Course   Clinical Course as of 07/11/24 0447  Thu Jul 11, 2024  0248 Patient having some improvement but still some residual angioedema. Will add steroids, already had full dose of antihistamine prior to arrival.  [CS]  0443 Patient with continued improvement. Sleeping soundly in no distress. Still some mild residual edema but much improved and comfortable going home. Plan Rx for steroids as this seemed to help. Avoid nuts and follow up with PCP/Allergy. RTED for worsening. Rx EpiPen  if needed.  [CS]    Clinical Course User Index [CS] Roselyn Carlin NOVAK, MD     MDM Rules/Calculators/A&P Medical Decision Making Problems Addressed: Angioedema, initial encounter: acute illness or injury  Risk Prescription drug management. Decision regarding hospitalization.     Final Clinical Impression(s) / ED Diagnoses Final diagnoses:  Angioedema, initial encounter    Rx / DC Orders ED Discharge Orders          Ordered    predniSONE (STERAPRED UNI-PAK 21 TAB) 10 MG (21) TBPK tablet  Status:  Discontinued        07/11/24 0445    EPINEPHrine  0.3 mg/0.3 mL IJ SOAJ injection  As needed,   Status:  Discontinued        07/11/24 0445    EPINEPHrine  0.3 mg/0.3 mL IJ SOAJ injection   As needed        07/11/24 0446    predniSONE (STERAPRED UNI-PAK 21 TAB) 10 MG (21) TBPK tablet        07/11/24 0446             Roselyn Carlin NOVAK, MD 07/11/24 [id number]

## 2024-08-19 ENCOUNTER — Ambulatory Visit: Admitting: Family Medicine

## 2024-08-19 ENCOUNTER — Encounter: Payer: Self-pay | Admitting: Family Medicine

## 2024-08-19 VITALS — BP 122/74 | HR 86 | Temp 98.6°F | Resp 16 | Ht 67.28 in | Wt 178.8 lb

## 2024-08-19 DIAGNOSIS — F909 Attention-deficit hyperactivity disorder, unspecified type: Secondary | ICD-10-CM

## 2024-08-19 DIAGNOSIS — Z91018 Allergy to other foods: Secondary | ICD-10-CM | POA: Insufficient documentation

## 2024-08-19 MED ORDER — AMPHETAMINE-DEXTROAMPHET ER 10 MG PO CP24: 10.0000 mg | ORAL_CAPSULE | Freq: Every day | ORAL | 0 refills | Status: AC

## 2024-08-19 NOTE — Assessment & Plan Note (Addendum)
 Problem has improved with Adderall XR 10 mg, which he takes during workdays. We discussed some side effects. In generally he has tolerated medication well. No changes today. PDMP reviewed. Follow-up in 5 to 6 months, before if needed.

## 2024-08-19 NOTE — Assessment & Plan Note (Addendum)
 Recently evaluated in the ED for angioedema after consuming cashews. He already has an appointment with immunologist and has an EpiPen .

## 2024-08-19 NOTE — Progress Notes (Unsigned)
 HPI: [name] is a 23 y.o. male, who is here today for chronic disease management.  Last seen on *** Discussed the use of AI scribe software for clinical note transcription with the patient, who gave verbal consent to proceed.  History of Present Illness William Diaz is a 23 year old male who presents for a follow-up regarding Adderall use and recent allergic reaction.  He uses Adderall on an as-needed basis, primarily when going to work. He experiences dry mouth as a side effect but denies headaches. He drinks plenty of water to mitigate the dry mouth and feels that the medication helps with focus.  He experienced an episode of angioedema after consuming a cashew, which led to an emergency department visit. He received an EpiPen  shot and steroids as the EpiPen  alone was not effective. He now has an EpiPen  at home for emergencies.  He reports sleeping approximately eight hours per night.  *** Review of Systems See other pertinent positives and negatives in HPI.  Medications Ordered Prior to Encounter[1]  Past Medical History:  Diagnosis Date   Abdominal migraine    ADHD (attention deficit hyperactivity disorder)    Inguinal hernia 01/2013   left   Seasonal allergies    Allergies[2]  Social History   Socioeconomic History   Marital status: Single    Spouse name: Not on file   Number of children: Not on file   Years of education: Not on file   Highest education level: Not on file  Occupational History   Not on file  Tobacco Use   Smoking status: Never   Smokeless tobacco: Never   Tobacco comments:    father smokes outside  Vaping Use   Vaping status: Every Day  Substance and Sexual Activity   Alcohol use: Yes    Alcohol/week: 5.0 standard drinks of alcohol    Types: 5 Cans of beer per week   Drug use: Not Currently    Types: Marijuana   Sexual activity: Never  Other Topics Concern   Not on file  Social History Narrative    [name] attends 8 th grade at [employer]. He is doing well.   He enjoys playing soccer.   Lives with his parents and sibling.    Social Drivers of Health   Tobacco Use: Low Risk (08/19/2024)   Patient History    Smoking Tobacco Use: Never    Smokeless Tobacco Use: Never    Passive Exposure: Not on file  Financial Resource Strain: Not on file  Food Insecurity: Not on file  Transportation Needs: Not on file  Physical Activity: Not on file  Stress: Not on file  Social Connections: Not on file  Depression (PHQ2-9): Low Risk (08/19/2024)   Depression (PHQ2-9)    PHQ-2 Score: 0  Alcohol Screen: Not on file  Housing: Not on file  Utilities: Not on file  Health Literacy: Not on file    Vitals:   08/19/24 1329  BP: 122/74  Pulse: 86  Resp: 16  Temp: 98.6 F (37 C)  SpO2: 98%   Body mass index is 27.77 kg/m.  Physical Exam Vitals and nursing note reviewed.  Constitutional:      General: He is not in acute distress.    Appearance: He is well-developed.  HENT:     Head: Normocephalic and atraumatic.  Eyes:     Conjunctiva/sclera: Conjunctivae normal.  Cardiovascular:     Rate and Rhythm: Normal rate and regular rhythm.  Heart sounds: No murmur heard. Pulmonary:     Effort: Pulmonary effort is normal. No respiratory distress.     Breath sounds: Normal breath sounds.  Abdominal:     Palpations: Abdomen is soft. There is no hepatomegaly or mass.     Tenderness: There is no abdominal tenderness.  Skin:    General: Skin is warm.     Findings: No erythema.  Neurological:     General: No focal deficit present.     Mental Status: He is alert and oriented to person, place, and time.     Gait: Gait normal.  Psychiatric:        Mood and Affect: Mood and affect normal.    ASSESSMENT AND PLAN:  William Diaz was seen today for adhd.  Diagnoses and all orders for this visit:  Attention deficit hyperactivity disorder (ADHD), unspecified ADHD type Assessment &  Plan: Problem has improved with Adderall XR 10 mg, which he takes during workdays. We discussed some side effects. In generally he has tolerated medication well. No changes today. PDMP reviewed. Follow-up in 5 to 6 months, before if needed.  Orders: -     Amphetamine -Dextroamphet ER; Take 1 capsule (10 mg total) by mouth daily.  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 He already has an appointment with immunologist and has an EpiPen .      No follow-ups on file.  William Diaz G. Hades Mathew, MD  [facility] [place] office.                [1]  Current Outpatient Medications on File Prior to Visit  Medication Sig Dispense Refill   EPINEPHrine  0.3 mg/0.3 mL IJ SOAJ injection Inject 0.3 mg into the muscle as needed for anaphylaxis. 1 each 0   fluticasone  (FLONASE ) 50 MCG/ACT nasal spray Place 2 sprays into both nostrils daily as needed for allergies or rhinitis. (Patient not taking: Reported on 07/01/2024) 16 g 3   levocetirizine (XYZAL) 5 MG tablet Take 5 mg by mouth every evening.     No current facility-administered medications on file prior to visit.  [2]  Allergies Allergen Reactions   Amoxicillin Rash

## 2025-01-20 ENCOUNTER — Ambulatory Visit: Admitting: Family Medicine
